# Patient Record
Sex: Female | Born: 1985 | Race: Black or African American | Hispanic: No | Marital: Single | Smoking: Never smoker
Health system: Southern US, Community
[De-identification: ages and names within clinical notes are randomized; demographics above are authoritative.]

## PROBLEM LIST (undated history)

## (undated) DIAGNOSIS — O039 Complete or unspecified spontaneous abortion without complication: Secondary | ICD-10-CM

## (undated) HISTORY — PX: WISDOM TOOTH EXTRACTION: SHX21

## (undated) HISTORY — DX: Complete or unspecified spontaneous abortion without complication: O03.9

---

## 2017-09-29 ENCOUNTER — Emergency Department: Payer: BLUE CROSS/BLUE SHIELD

## 2017-09-29 ENCOUNTER — Emergency Department
Admission: EM | Admit: 2017-09-29 | Discharge: 2017-09-29 | Disposition: A | Discharge status: Home / Self Care | Payer: BLUE CROSS/BLUE SHIELD | Attending: Emergency Medicine | Admitting: Emergency Medicine

## 2017-09-29 ENCOUNTER — Encounter: Payer: Self-pay | Admitting: Emergency Medicine

## 2017-09-29 DIAGNOSIS — O4691 Antepartum hemorrhage, unspecified, first trimester: Secondary | ICD-10-CM | POA: Diagnosis not present

## 2017-09-29 DIAGNOSIS — O209 Hemorrhage in early pregnancy, unspecified: Secondary | ICD-10-CM | POA: Diagnosis not present

## 2017-09-29 DIAGNOSIS — N939 Abnormal uterine and vaginal bleeding, unspecified: Secondary | ICD-10-CM | POA: Diagnosis not present

## 2017-09-29 DIAGNOSIS — Z3A10 10 weeks gestation of pregnancy: Secondary | ICD-10-CM | POA: Insufficient documentation

## 2017-09-29 DIAGNOSIS — O4690 Antepartum hemorrhage, unspecified, unspecified trimester: Secondary | ICD-10-CM

## 2017-09-29 LAB — HCG, QUANTITATIVE, PREGNANCY: HCG, BETA CHAIN, QUANT, S: 139490 m[IU]/mL — AB (ref <5)

## 2017-09-29 LAB — CBC
HCT: 37.3 % (ref 35.0–47.0)
Hemoglobin: 12.7 g/dL (ref 12.0–16.0)
MCH: 33.1 pg (ref 26.0–34.0)
MCHC: 34 g/dL (ref 32.0–36.0)
MCV: 97.3 fL (ref 80.0–100.0)
PLATELETS: 299 10*3/uL (ref 150–440)
RBC: 3.83 MIL/uL (ref 3.80–5.20)
RDW: 12.9 % (ref 11.5–14.5)
WBC: 6.2 10*3/uL (ref 3.6–11.0)

## 2017-09-29 LAB — ABO/RH: ABO/RH(D): O POS

## 2017-09-29 NOTE — ED Notes (Signed)
Pt taken to US

## 2017-09-29 NOTE — ED Triage Notes (Signed)
Pt to ED c/o vaginal bleeding today.  States is pregnant but unsure how far along.  Has had abd cramping x1 week.  OBGYN appt tomorrow.

## 2017-09-29 NOTE — ED Notes (Signed)
Pt states abd cramping x week, noticed today vaginal bleeding. Denies clots. States bright red blood. Thinks she is about [redacted] weeks pregnant. 4th pregnancy, 2 living, 1 abortion. Alert, oriented, ambulatory. No previous pregnancy complications.

## 2017-09-29 NOTE — ED Provider Notes (Signed)
[facility] Emergency Department Provider Note   ____________________________________________    I have reviewed the triage vital signs and the nursing notes.   HISTORY  Chief Complaint Vaginal Bleeding     HPI Amanda Stuart is a 31 y.o. female Who presents with complaints of vaginal bleeding. Patient reports she is approximately [redacted] weeks pregnant. G4 P2. Last period was August 5. She reports she developed mild lower abdominal cramping sensation yesterday evening. This morning while she was at work she went to the bathroom and noted vaginal bleeding. No dizziness.   History reviewed. No pertinent past medical history.  There are no active problems to display for this patient.   Past Surgical History:  Procedure Laterality Date  . CESAREAN SECTION    . WISDOM TOOTH EXTRACTION      Prior to Admission medications   Not on File     Allergies Morphine and related  History reviewed. No pertinent family history.  Social History Social History  Substance Use Topics  . Smoking status: Never Smoker  . Smokeless tobacco: Never Used  . Alcohol use Yes     Comment: occ.    Review of Systems  Constitutional: No fever/chills Eyes: No visual changes.  ENT: No sore throat. Cardiovascular: Denies chest pain. Respiratory: Denies shortness of breath. Gastrointestinal: No nausea, no vomiting.   Genitourinary: vaginal bleeding as above Musculoskeletal: Negative for back pain. Skin: Negative for rash. Neurological: Negative for headaches   ____________________________________________   PHYSICAL EXAM:  VITAL SIGNS: ED Triage Vitals  Enc Vitals Group     BP 09/29/17 1622 121/77     Pulse Rate 09/29/17 1622 71     Resp 09/29/17 1622 20     Temp 09/29/17 1622 98.1 F (36.7 C)     Temp Source 09/29/17 1622 Oral     SpO2 09/29/17 1622 100 %     Weight 09/29/17 1623 71.7 kg (158 lb)     Height 09/29/17 1623 1.676 m ( )   Head Circumference --      Peak Flow --      Pain Score 09/29/17 1623 7     Pain Loc --      Pain Edu? --      Excl. in GC? --     Constitutional: Alert and oriented. No acute distress. Pleasant and interactive Eyes: Conjunctivae are normal.   Nose: No congestion/rhinnorhea. Mouth/Throat: Mucous membranes are moist.    Cardiovascular: Normal rate, regular rhythm.   Good peripheral circulation. Respiratory: Normal respiratory effort.  No retractions. Gastrointestinal: Soft and nontender. No distention.  No CVA tenderness. Genitourinary: deferred Musculoskeletal:   Warm and well perfused Neurologic:  Normal speech and language. No gross focal neurologic deficits are appreciated.  Skin:  Skin is warm, dry and intact. No rash noted. Psychiatric: Mood and affect are normal. Speech and behavior are normal.  ____________________________________________   LABS (all labs ordered are listed, but only abnormal results are displayed)  Labs Reviewed  HCG, QUANTITATIVE, PREGNANCY - Abnormal; Notable for the following:       Result Value   hCG, Beta Chain, Quant, S 139,490 (*)    All other components within normal limits  CBC  ABO/RH   ____________________________________________  EKG  None ____________________________________________  RADIOLOGY  ultrasoundshows 10 week IUP ____________________________________________   PROCEDURES  Procedure(s) performed: No    Critical Care performed:No ____________________________________________   INITIAL IMPRESSION / ASSESSMENT AND PLAN / ED COURSE  Pertinent labs &  imaging results that were available during my care of the patient were reviewed by me and considered in my medical decision making (see chart for details).  patient presents with complaints of vaginal bleeding as above.  Differential diagnosis includes miscarriage, ectopic pregnancy, threatened miscarriage  We will check labs/hcg and obtain US  ultrasound is  reassuring, patient is a positive, vitals are stable. Hemoglobin is normal. Has OB follow-up tomorrow    ____________________________________________   FINAL CLINICAL IMPRESSION(S) / ED DIAGNOSES  Final diagnoses:  Vaginal bleeding in pregnancy      NEW MEDICATIONS STARTED DURING THIS VISIT:  There are no discharge medications for this patient.    Note:  This document was prepared using Dragon voice recognition software and may include unintentional dictation errors.    Jene Every, MD 09/29/17 2328

## 2017-09-29 NOTE — ED Notes (Signed)
Called lab about ABO/Rh and CBC. They stated they could run tests.

## 2017-10-01 ENCOUNTER — Encounter: Payer: Self-pay | Admitting: Obstetrics and Gynecology

## 2017-10-01 ENCOUNTER — Ambulatory Visit (INDEPENDENT_AMBULATORY_CARE_PROVIDER_SITE_OTHER): Payer: BLUE CROSS/BLUE SHIELD | Admitting: Obstetrics and Gynecology

## 2017-10-01 VITALS — BP 110/71 | HR 68 | Ht 66.0 in | Wt 156.1 lb

## 2017-10-01 DIAGNOSIS — L409 Psoriasis, unspecified: Secondary | ICD-10-CM

## 2017-10-01 DIAGNOSIS — B3731 Acute candidiasis of vulva and vagina: Secondary | ICD-10-CM

## 2017-10-01 DIAGNOSIS — B373 Candidiasis of vulva and vagina: Secondary | ICD-10-CM

## 2017-10-01 DIAGNOSIS — O200 Threatened abortion: Secondary | ICD-10-CM

## 2017-10-01 MED ORDER — TRIAMCINOLONE ACETONIDE 0.1 % EX CREA: TOPICAL_CREAM | Freq: Two times a day (BID) | CUTANEOUS | Status: DC

## 2017-10-01 NOTE — Progress Notes (Signed)
HPI:      Amanda Stuart is a 31 y.o. [phone] who LMP was Patient's last menstrual period was 07/27/2017 (exact date).  Subjective:   She presents today Because she was initially found to be pregnant and has been having vaginal bleeding. She visited the emergency department and they did an ultrasound revealing a 10 week intrauterine pregnancy with normal heart tones and no evidence of subchorionic hemorrhage. She reports her bleeding as brown spotting at this time. Her bleeding did follow intercourse 2 days ago. Her principal history is significant for previous cesarean delivery followed by VBAC. She is requesting TOLAC with this pregnancy. She has begun prenatal vitamins. She has psoriasis and uses triamcinolone cream and needs a refill. (She reports that she uses it sparingly and only for a day or 2 at a time.)      Hx: The following portions of the patient's history were reviewed and updated as appropriate:             She  has no past medical history on file. She  does not have a problem list on file. She  has a past surgical history that includes Wisdom tooth extraction and Cesarean section. Her family history includes Breast cancer in her other; Colon cancer in her maternal uncle; Ovarian cancer in her maternal grandmother; Stroke in her maternal uncle; Uterine cancer in her mother. She  reports that she has never smoked. She has never used smokeless tobacco. She reports that she does not drink alcohol or use drugs. She is allergic to morphine and related.       Review of Systems:  Review of Systems  Constitutional: Denied constitutional symptoms, night sweats, recent illness, fatigue, fever, insomnia and weight loss.  Eyes: Denied eye symptoms, eye pain, photophobia, vision change and visual disturbance.  Ears/Nose/Throat/Neck: Denied ear, nose, throat or neck symptoms, hearing loss, nasal discharge, sinus congestion and sore throat.  Cardiovascular: Denied cardiovascular  symptoms, arrhythmia, chest pain/pressure, edema, exercise intolerance, orthopnea and palpitations.  Respiratory: Denied pulmonary symptoms, asthma, pleuritic pain, productive sputum, cough, dyspnea and wheezing.  Gastrointestinal: Denied, gastro-esophageal reflux, melena, nausea and vomiting.  Genitourinary: See HPI for additional information.  Musculoskeletal: Denied musculoskeletal symptoms, stiffness, swelling, muscle weakness and myalgia.  Dermatologic: Denied dermatology symptoms, rash and scar.  Neurologic: Denied neurology symptoms, dizziness, headache, neck pain and syncope.  Psychiatric: Denied psychiatric symptoms, anxiety and depression.  Endocrine: Denied endocrine symptoms including hot flashes and night sweats.   Meds:   No current outpatient prescriptions on file prior to visit.   No current facility-administered medications on file prior to visit.     Objective:     Vitals:   10/01/17 1112  BP: 110/71  Pulse: 68              Physical examination   Pelvic:   Vulva: Normal appearance.  No lesions.  Vagina: No lesions or abnormalities noted.  Small amount of dark blood noted.   Support: Normal pelvic support.  Urethra No masses tenderness or scarring.  Meatus Normal size without lesions or prolapse.  Cervix: Normal appearance.  No lesions.  Anus: Normal exam.  No lesions.  Perineum: Normal exam.  No lesions.        Bimanual   Uterus: 10 weeks size  Non-tender.  Mobile.  AV.  Adnexae: No masses.  Non-tender to palpation.  Cul-de-sac: Negative for abnormality.   WET PREP: clue cells: absent, KOH (yeast): positive, odor: absent and trichomoniasis: negative Ph:  <  4.5    Assessment:    [id number] There are no active problems to display for this patient.    1. Threatened abortion in first trimester   2. Monilial vulvovaginitis   3. Psoriasis        Plan:            1.  SAb I have discussed the possibility of miscarriage with the patient.  I have  informed her that vaginal bleeding, cramping or passage of tissue are the most common signs of miscarriage.  Should she have heavy bleeding, pass tissue or have other problems, she has been informed to call the office immediately.  I have advised her to remain at pelvic rest for at least one week after her last episode of bleeding.  If she is currently working, I have instructed her to discontinue until this situation resolves.  Complete versus incomplete miscarriage was discussed and the patient is aware that should she develop heavy bleeding, fever, or persistent crampy pelvic pain she may require a D & E to remove the remaining portion of the miscarried pregnancy.  I advised her to keep me informed should her condition change. Normal intrauterine pregnancy-doubt high risk of miscarriage. 2.  Pap GC/CT performed 3.  Continue prenatal vitamins 4.  Nurse visit scheduled 5.  Triamcinolone cream for psoriasis discussed 6.  Monistat for monilia (symptomatic in first trimester)  Orders No orders of the defined types were placed in this encounter.    Meds ordered this encounter  Medications  . triamcinolone cream (KENALOG) 0.1 %      F/U  Return in about 3 weeks (around 10/22/2017).  Elonda Husky, M.D. 10/01/2017 12:58 PM

## 2017-10-02 ENCOUNTER — Telehealth: Payer: Self-pay | Admitting: Obstetrics and Gynecology

## 2017-10-02 MED ORDER — TRIAMCINOLONE ACETONIDE 0.1 % EX CREA: 1.0000 "application " | TOPICAL_CREAM | Freq: Two times a day (BID) | CUTANEOUS | 0 refills | Status: AC

## 2017-10-02 NOTE — Telephone Encounter (Signed)
Message left on pts voicemail- script sent to pharmacy and confirmation received.

## 2017-10-02 NOTE — Telephone Encounter (Signed)
Patient called and stated that she went to her pharmacy yesterday to pick up her prescription, and her medication was not there triamcinolone cream (KENALOG) 0.1 %. The patient stated that her preferred pharmacy is [facility] [address] in [place]. No other information was disclosed. Please advise.

## 2017-10-03 ENCOUNTER — Telehealth: Payer: Self-pay

## 2017-10-03 LAB — PAP IG, CT-NG, RFX HPV ASCU
CHLAMYDIA, NUC. ACID AMP: NEGATIVE
Gonococcus by Nucleic Acid Amp: NEGATIVE
PAP SMEAR COMMENT: 0

## 2017-10-03 NOTE — Telephone Encounter (Signed)
Message left on pts voicemail- neg results per provider. 

## 2017-10-03 NOTE — Telephone Encounter (Signed)
-----   Message from Linzie Collin, MD sent at 10/03/2017  8:49 AM EDT ----- Negative Pap and Negative GC/CT and yeast infection confirmed

## 2017-10-06 ENCOUNTER — Telehealth: Payer: Self-pay

## 2017-10-06 MED ORDER — TERCONAZOLE 0.4 % VA CREA: 1.0000 | TOPICAL_CREAM | Freq: Every day | VAGINAL | 0 refills | Status: DC

## 2017-10-06 NOTE — Telephone Encounter (Signed)
Spoke with pt- advised of negative test results per provider with understanding. Also states she still has yeast infection symptoms- has tried OTC products with no relief. C/o vag itching and discharge. Would like something sent into [facility] on [address] [place].

## 2017-10-06 NOTE — Telephone Encounter (Signed)
-----   Message from David James Evans, MD sent at 10/03/2017  8:49 AM EDT ----- Negative Pap and Negative GC/CT and yeast infection confirmed 

## 2017-10-06 NOTE — Telephone Encounter (Signed)
Pt informed script is at [facility] on [address].

## 2017-10-06 NOTE — Telephone Encounter (Signed)
Informed pt of negative result per provider and she is c/o vaginal itching and discharge- provider informed.

## 2017-10-13 ENCOUNTER — Ambulatory Visit (INDEPENDENT_AMBULATORY_CARE_PROVIDER_SITE_OTHER): Payer: BLUE CROSS/BLUE SHIELD | Admitting: Obstetrics and Gynecology

## 2017-10-13 ENCOUNTER — Telehealth: Payer: Self-pay

## 2017-10-13 VITALS — BP 101/73 | HR 69 | Ht 66.0 in | Wt 161.3 lb

## 2017-10-13 DIAGNOSIS — Z3481 Encounter for supervision of other normal pregnancy, first trimester: Secondary | ICD-10-CM | POA: Diagnosis not present

## 2017-10-13 DIAGNOSIS — Z113 Encounter for screening for infections with a predominantly sexual mode of transmission: Secondary | ICD-10-CM

## 2017-10-13 DIAGNOSIS — Z1389 Encounter for screening for other disorder: Secondary | ICD-10-CM

## 2017-10-13 NOTE — Patient Instructions (Signed)
Pregnancy and Zika Virus Disease Zika virus disease, or Zika, is an illness that can spread to people from mosquitoes that carry the virus. It may also spread from person to person through infected body fluids. Zika first occurred in [place], but recently it has spread to new areas. The virus occurs in tropical climates. The location of Zika continues to change. Most people who become infected with Zika virus do not develop serious illness. However, Zika may cause birth defects in an unborn baby whose mother is infected with the virus. It may also increase the risk of miscarriage. What are the symptoms of Zika virus disease? In many cases, people who have been infected with Zika virus do not develop any symptoms. If symptoms appear, they usually start about a week after the person is infected. Symptoms are usually mild. They may include:  Fever.  Rash.  Red eyes.  Joint pain.  How does Zika virus disease spread? The main way that Zika virus spreads is through the bite of a certain type of mosquito. Unlike most types of mosquitos, which bite only at night, the type of mosquito that carries Zika virus bites both at night and during the day. Zika virus can also spread through sexual contact, through a blood transfusion, and from a mother to her baby before or during birth. Once you have had Zika virus disease, it is unlikely that you will get it again. Can I pass Zika to my baby during pregnancy? Yes, Zika can pass from a mother to her baby before or during birth. What problems can Zika cause for my baby? A woman who is infected with Zika virus while pregnant is at risk of having her baby born with a condition in which the brain or head is smaller than expected (microcephaly). Babies who have microcephaly can have developmental delays, seizures, hearing problems, and vision problems. Having Zika virus disease during pregnancy can also increase the risk of miscarriage. How can Zika virus disease be  prevented? There is no vaccine to prevent Zika. The best way to prevent the disease is to avoid infected mosquitoes and avoid exposure to body fluids that can spread the virus. Avoid any possible exposure to Zika by taking the following precautions. For women and their sex partners:  Avoid traveling to high-risk areas. The locations where Zika is being reported change often. To identify high-risk areas, check the CDC travel website: www.cdc.gov/zika/geo/index.html  If you or your sex partner must travel to a high-risk area, talk with a health care provider before and after traveling.  Take all precautions to avoid mosquito bites if you live in, or travel to, any of the high-risk areas. Insect repellents are safe to use during pregnancy.  Ask your health care provider when it is safe to have sexual contact.  For women:  If you are pregnant or trying to become pregnant, avoid sexual contact with persons who may have been exposed to Zika virus, persons who have possible symptoms of Zika, or persons whose history you are unsure about. If you choose to have sexual contact with someone who may have been exposed to Zika virus, use condoms correctly during the entire duration of sexual activity, every time. Do not share sexual devices, as you may be exposed to body fluids.  Ask your health care provider about when it is safe to attempt pregnancy after a possible exposure to Zika virus.  What steps should I take to avoid mosquito bites? Take these steps to avoid mosquito bites   when you are in a high-risk area:  Wear loose clothing that covers your arms and legs.  Limit your outdoor activities.  Do not open windows unless they have window screens.  Sleep under mosquito nets.  Use insect repellent. The best insect repellents have:  DEET, picaridin, oil of lemon eucalyptus (OLE), or IR3535 in them.  Higher amounts of an active ingredient in them.  Remember that insect repellents are safe to  use during pregnancy.  Do not use OLE on children who are younger than 3 years of age. Do not use insect repellent on babies who are younger than 2 months of age.  Cover your child's stroller with mosquito netting. Make sure the netting fits snugly and that any loose netting does not cover your child's mouth or nose. Do not use a blanket as a mosquito-protection cover.  Do not apply insect repellent underneath clothing.  If you are using sunscreen, apply the sunscreen before applying the insect repellent.  Treat clothing with permethrin. Do not apply permethrin directly to your skin. Follow label directions for safe use.  Get rid of standing water, where mosquitoes may reproduce. Standing water is often found in items such as buckets, bowls, animal food dishes, and flowerpots.  When you return from traveling to any high-risk area, continue taking actions to protect yourself against mosquito bites for 3 weeks, even if you show no signs of illness. This will prevent spreading Zika virus to uninfected mosquitoes. What should I know about the sexual transmission of Zika? People can spread Zika to their sexual partners during vaginal, anal, or oral sex, or by sharing sexual devices. Many people with Zika do not develop symptoms, so a person could spread the disease without knowing that they are infected. The greatest risk is to women who are pregnant or who may become pregnant. Zika virus can live longer in semen than it can live in blood. Couples can prevent sexual transmission of the virus by:  Using condoms correctly during the entire duration of sexual activity, every time. This includes vaginal, anal, and oral sex.  Not sharing sexual devices. Sharing increases your risk of being exposed to body fluid from another person.  Avoiding all sexual activity until your health care provider says it is safe.  Should I be tested for Zika virus? A sample of your blood can be tested for Zika virus. A  pregnant woman should be tested if she may have been exposed to the virus or if she has symptoms of Zika. She may also have additional tests done during her pregnancy, such ultrasound testing. Talk with your health care provider about which tests are recommended. This information is not intended to replace advice given to you by your health care provider. Make sure you discuss any questions you have with your health care provider. Document Released: 08/30/2015 Document Revised: 05/16/2016 Document Reviewed: 08/23/2015 Elsevier Interactive Patient Education  2018 [employer] Commonly Asked Questions During Pregnancy  Cats: A parasite can be excreted in cat feces.  To avoid exposure you need to have another person empty the little box.  If you must empty the litter box you will need to wear gloves.  Wash your hands after handling your cat.  This parasite can also be found in raw or undercooked meat so this should also be avoided.  Colds, Sore Throats, Flu: Please check your medication sheet to see what you can take for symptoms.  If your symptoms are unrelieved by these medications please call the   office.  Dental Work: Most any dental work your dentist recommends is permitted.  X-rays should only be taken during the first trimester if absolutely necessary.  Your abdomen should be shielded with a lead apron during all x-rays.  Please notify your provider prior to receiving any x-rays.  Novocaine is fine; gas is not recommended.  If your dentist requires a note from us prior to dental work please call the office and we will provide one for you.  Exercise: Exercise is an important part of staying healthy during your pregnancy.  You may continue most exercises you were accustomed to prior to pregnancy.  Later in your pregnancy you will most likely notice you have difficulty with activities requiring balance like riding a bicycle.  It is important that you listen to your body and avoid activities that put  you at a higher risk of falling.  Adequate rest and staying well hydrated are a must!  If you have questions about the safety of specific activities ask your provider.    Exposure to Children with illness: Try to avoid obvious exposure; report any symptoms to us when noted,  If you have chicken pos, red measles or mumps, you should be immune to these diseases.   Please do not take any vaccines while pregnant unless you have checked with your OB provider.  Fetal Movement: After 28 weeks we recommend you do "kick counts" twice daily.  Lie or sit down in a calm quiet environment and count your baby movements "kicks".  You should feel your baby at least 10 times per hour.  If you have not felt 10 kicks within the first hour get up, walk around and have something sweet to eat or drink then repeat for an additional hour.  If count remains less than 10 per hour notify your provider.  Fumigating: Follow your pest control agent's advice as to how long to stay out of your home.  Ventilate the area well before re-entering.  Hemorrhoids:   Most over-the-counter preparations can be used during pregnancy.  Check your medication to see what is safe to use.  It is important to use a stool softener or fiber in your diet and to drink lots of liquids.  If hemorrhoids seem to be getting worse please call the office.   Hot Tubs:  Hot tubs Jacuzzis and saunas are not recommended while pregnant.  These increase your internal body temperature and should be avoided.  Intercourse:  Sexual intercourse is safe during pregnancy as long as you are comfortable, unless otherwise advised by your provider.  Spotting may occur after intercourse; report any bright red bleeding that is heavier than spotting.  Labor:  If you know that you are in labor, please go to the hospital.  If you are unsure, please call the office and let us help you decide what to do.  Lifting, straining, etc:  If your job requires heavy lifting or straining  please check with your provider for any limitations.  Generally, you should not lift items heavier than that you can lift simply with your hands and arms (no back muscles)  Painting:  Paint fumes do not harm your pregnancy, but may make you ill and should be avoided if possible.  Latex or water based paints have less odor than oils.  Use adequate ventilation while painting.  Permanents & Hair Color:  Chemicals in hair dyes are not recommended as they cause increase hair dryness which can increase hair loss during pregnancy.  "   Highlighting" and permanents are allowed.  Dye may be absorbed differently and permanents may not hold as well during pregnancy.  Sunbathing:  Use a sunscreen, as skin burns easily during pregnancy.  Drink plenty of fluids; avoid over heating.  Tanning Beds:  Because their possible side effects are still unknown, tanning beds are not recommended.  Ultrasound Scans:  Routine ultrasounds are performed at approximately 20 weeks.  You will be able to see your baby's general anatomy an if you would like to know the gender this can usually be determined as well.  If it is questionable when you conceived you may also receive an ultrasound early in your pregnancy for dating purposes.  Otherwise ultrasound exams are not routinely performed unless there is a medical necessity.  Although you can request a scan we ask that you pay for it when conducted because insurance does not cover " patient request" scans.  Work: If your pregnancy proceeds without complications you may work until your due date, unless your physician or employer advises otherwise.  Round Ligament Pain/Pelvic Discomfort:  Sharp, shooting pains not associated with bleeding are fairly common, usually occurring in the second trimester of pregnancy.  They tend to be worse when standing up or when you remain standing for long periods of time.  These are the result of pressure of certain pelvic ligaments called "round  ligaments".  Rest, Tylenol and heat seem to be the most effective relief.  As the womb and fetus grow, they rise out of the pelvis and the discomfort improves.  Please notify the office if your pain seems different than that described.  It may represent a more serious condition.   

## 2017-10-13 NOTE — Progress Notes (Signed)
Amanda Stuart presents for NOB nurse interview visit. Pregnancy confirmation done at ED on 09/29/2017. BHCG: 139,490. Pt was seen for vaginal spotting. Currently having some brown spotting 2-3 days after intercourse. No bright red vaginal bleeding. LMP: 07/27/2017-exact. Ultrasound done 09/29/2017 at ED which resulted in EGA-10wks.  Rh/ABO (O+).  G-4.  P-2012. Pregnancy education material explained and given.  No cats in the home. NOB labs ordered. HIV labs and Drug screen were explained optional.  Drug screen/nicotine screening declined. PNV encouraged. Genetic screening options discussed. Genetic testing: Declined.  Pt may discuss with provider. Pt. To follow up with provider as scheduled with Dr. Logan BoresEvans for NOB physical.  All questions answered.

## 2017-10-13 NOTE — Telephone Encounter (Signed)
Pt would like a refill of Triamcinolone Cream (Kenalog).

## 2017-10-14 LAB — URINALYSIS, ROUTINE W REFLEX MICROSCOPIC
Bilirubin, UA: NEGATIVE
GLUCOSE, UA: NEGATIVE
KETONES UA: NEGATIVE
LEUKOCYTES UA: NEGATIVE
NITRITE UA: NEGATIVE
PH UA: 6 (ref 5.0–7.5)
PROTEIN UA: NEGATIVE
Specific Gravity, UA: 1.019 (ref 1.005–1.030)
UUROB: 0.2 mg/dL (ref 0.2–1.0)

## 2017-10-14 LAB — CBC WITH DIFFERENTIAL/PLATELET
BASOS: 1 %
Basophils Absolute: 0 10*3/uL (ref 0.0–0.2)
EOS (ABSOLUTE): 0.1 10*3/uL (ref 0.0–0.4)
Eos: 1 %
Hematocrit: 38.9 % (ref 34.0–46.6)
Hemoglobin: 12.4 g/dL (ref 11.1–15.9)
Immature Grans (Abs): 0 10*3/uL (ref 0.0–0.1)
Immature Granulocytes: 0 %
Lymphocytes Absolute: 1.1 10*3/uL (ref 0.7–3.1)
Lymphs: 21 %
MCH: 32.7 pg (ref 26.6–33.0)
MCHC: 31.9 g/dL (ref 31.5–35.7)
MCV: 103 fL — ABNORMAL HIGH (ref 79–97)
MONOS ABS: 0.3 10*3/uL (ref 0.1–0.9)
Monocytes: 6 %
NEUTROS ABS: 3.8 10*3/uL (ref 1.4–7.0)
Neutrophils: 71 %
PLATELETS: 350 10*3/uL (ref 150–379)
RBC: 3.79 x10E6/uL (ref 3.77–5.28)
RDW: 12.7 % (ref 12.3–15.4)
WBC: 5.3 10*3/uL (ref 3.4–10.8)

## 2017-10-14 LAB — RPR: RPR Ser Ql: NONREACTIVE

## 2017-10-14 LAB — RUBELLA SCREEN: Rubella Antibodies, IGG: 1.89 index (ref >0.99)

## 2017-10-14 LAB — MICROSCOPIC EXAMINATION: Casts: NONE SEEN /lpf

## 2017-10-14 LAB — VARICELLA ZOSTER ANTIBODY, IGG: Varicella zoster IgG: <135 index — ABNORMAL LOW (ref >165)

## 2017-10-14 LAB — ANTIBODY SCREEN: ANTIBODY SCREEN: NEGATIVE

## 2017-10-14 LAB — HIV ANTIBODY (ROUTINE TESTING W REFLEX): HIV Screen 4th Generation wRfx: NONREACTIVE

## 2017-10-14 LAB — HEPATITIS B SURFACE ANTIGEN: Hepatitis B Surface Ag: NEGATIVE

## 2017-10-14 LAB — SICKLE CELL SCREEN: Sickle Cell Screen: NEGATIVE

## 2017-10-15 LAB — GC/CHLAMYDIA PROBE AMP
Chlamydia trachomatis, NAA: NEGATIVE
Neisseria gonorrhoeae by PCR: NEGATIVE

## 2017-10-15 LAB — CULTURE, OB URINE

## 2017-10-15 LAB — URINE CULTURE, OB REFLEX

## 2017-10-16 NOTE — Telephone Encounter (Signed)
Will that be a yes or no? please

## 2017-10-22 ENCOUNTER — Ambulatory Visit (INDEPENDENT_AMBULATORY_CARE_PROVIDER_SITE_OTHER): Payer: BLUE CROSS/BLUE SHIELD | Admitting: Obstetrics and Gynecology

## 2017-10-22 ENCOUNTER — Encounter: Payer: Self-pay | Admitting: Obstetrics and Gynecology

## 2017-10-22 VITALS — BP 120/76 | HR 68 | Wt 158.2 lb

## 2017-10-22 DIAGNOSIS — Z3482 Encounter for supervision of other normal pregnancy, second trimester: Secondary | ICD-10-CM

## 2017-10-22 DIAGNOSIS — N898 Other specified noninflammatory disorders of vagina: Secondary | ICD-10-CM

## 2017-10-22 DIAGNOSIS — O26892 Other specified pregnancy related conditions, second trimester: Secondary | ICD-10-CM | POA: Diagnosis not present

## 2017-10-22 LAB — POCT URINALYSIS DIPSTICK
Bilirubin, UA: NEGATIVE
Blood, UA: NEGATIVE
Glucose, UA: NEGATIVE
KETONES UA: NEGATIVE
LEUKOCYTES UA: NEGATIVE
NITRITE UA: NEGATIVE
PH UA: 5 (ref 5.0–8.0)
PROTEIN UA: NEGATIVE
Spec Grav, UA: 1.015 (ref 1.010–1.025)
UROBILINOGEN UA: 0.2 U/dL

## 2017-10-22 NOTE — Progress Notes (Signed)
NOB: Discussed genetic testing in detail-patient considering.  Patient sure that she wants TOLAC - R/B reviewed in detail.  Complaining of vaginal irritation despite use of Monistat. -Nuswab done.  Physical examination General NAD, Conversant  HEENT Atraumatic; Op clear with mmm.  Normo-cephalic. Pupils reactive. Anicteric sclerae  Thyroid/Neck Smooth without nodularity or enlargement. Normal ROM.  Neck Supple.  Skin No rashes, lesions or ulceration. Normal palpated skin turgor. No nodularity.  Breasts: No masses or discharge.  Symmetric.  No axillary adenopathy.  Lungs: Clear to auscultation.No rales or wheezes. Normal Respiratory effort, no retractions.  Heart: NSR.  No murmurs or rubs appreciated. No periferal edema  Abdomen: Soft.  Non-tender.  No masses.  No HSM. No hernia  Extremities: Moves all appropriately.  Normal ROM for age. No lymphadenopathy.  Neuro: Oriented to PPT.  Normal mood. Normal affect.     Pelvic:   Vulva: Normal appearance.  No lesions.  Vagina: No lesions or abnormalities noted.  Support: Normal pelvic support.  Urethra No masses tenderness or scarring.  Meatus Normal size without lesions or prolapse.  Cervix: Normal appearance.  No lesions.  Anus: Normal exam.  No lesions.  Perineum: Normal exam.  No lesions.        Bimanual   Adnexae: No masses.  Non-tender to palpation.  Uterus: Enlarged. 13wks  143FHTs  Non-tender.  Mobile.  AV.  Adnexae: No masses.  Non-tender to palpation.  Cul-de-sac: Negative for abnormality.  Adnexae: No masses.  Non-tender to palpation.         Pelvimetry   Diagonal: Reached.  Spines: Average.  Sacrum: Concave.  Pubic Arch: Normal.

## 2017-10-25 LAB — NUSWAB VAGINITIS PLUS (VG+)
CANDIDA ALBICANS, NAA: NEGATIVE
CANDIDA GLABRATA, NAA: NEGATIVE
CHLAMYDIA TRACHOMATIS, NAA: NEGATIVE
NEISSERIA GONORRHOEAE, NAA: NEGATIVE
TRICH VAG BY NAA: NEGATIVE

## 2017-10-27 ENCOUNTER — Telehealth: Payer: Self-pay

## 2017-10-27 NOTE — Telephone Encounter (Signed)
Pt informed of negative test results 

## 2017-10-27 NOTE — Telephone Encounter (Signed)
-----   Message from Linzie Collinavid James Evans, MD sent at 10/27/2017  8:37 AM EST ----- All results within normal range.  Negative for infection.

## 2017-11-19 ENCOUNTER — Ambulatory Visit (INDEPENDENT_AMBULATORY_CARE_PROVIDER_SITE_OTHER): Payer: BLUE CROSS/BLUE SHIELD | Admitting: Obstetrics and Gynecology

## 2017-11-19 ENCOUNTER — Encounter: Payer: Self-pay | Admitting: Obstetrics and Gynecology

## 2017-11-19 VITALS — BP 106/67 | HR 66 | Wt 162.1 lb

## 2017-11-19 DIAGNOSIS — Z3482 Encounter for supervision of other normal pregnancy, second trimester: Secondary | ICD-10-CM

## 2017-11-19 NOTE — Progress Notes (Signed)
ROB- Pt is c/o headache, states she has tried tylenol, it has given her some relief, she wants to assure if this will be on going, pt has had some tightness in her stomach unsure if they are braxton hicks, Flu-Declines

## 2017-11-19 NOTE — Progress Notes (Signed)
ROB: Patient c/o of headaches almost daily, mostly relieved by Tylenol.  Notes that she was a big energy drink consumer prior to pregnancy. Discussed caffeine withdrawal effects, and that she can consume up to 1-2 cups of caffeine per day (not including energy drinks). Also noting some pelvic cramping and tightness but denies bleeding.  Declines flu vaccine and genetic screen. RTC in 4 weeks. For anatomy scan at that time.

## 2017-11-25 IMAGING — US US OB COMP LESS 14 WK
1 series · 14 of 28 positions shown · non-contrast
Comparison: None.

CLINICAL DATA: Cramping for 1 week.  Vaginal bleeding today.

EXAM:
OBSTETRIC <14 WK ULTRASOUND
TECHNIQUE: Transabdominal ultrasound was performed for evaluation of the
gestation as well as the maternal uterus and adnexal regions.

[Series 1: us ob comp less 14 wk · 0.13mm/px · 14 of 63 slices shown]
[im 3/63]
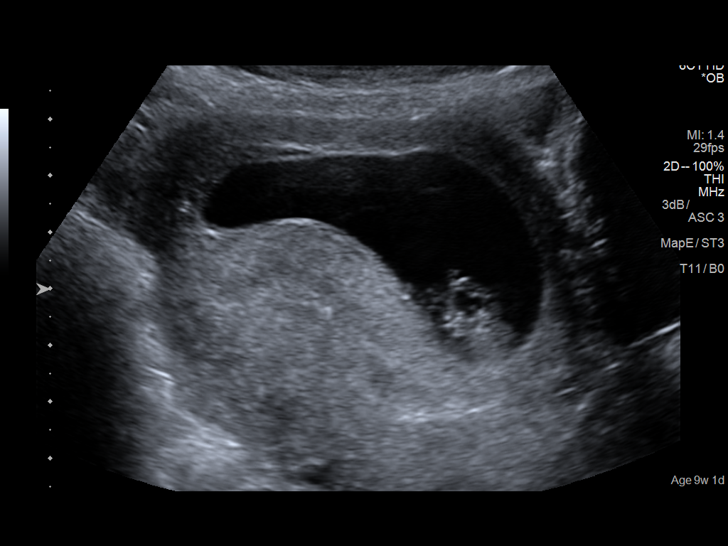
[im 7/63]
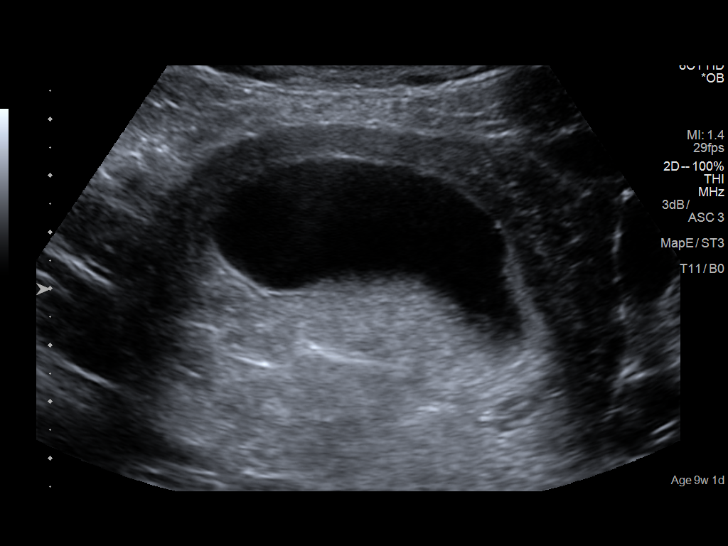
[im 12/63]
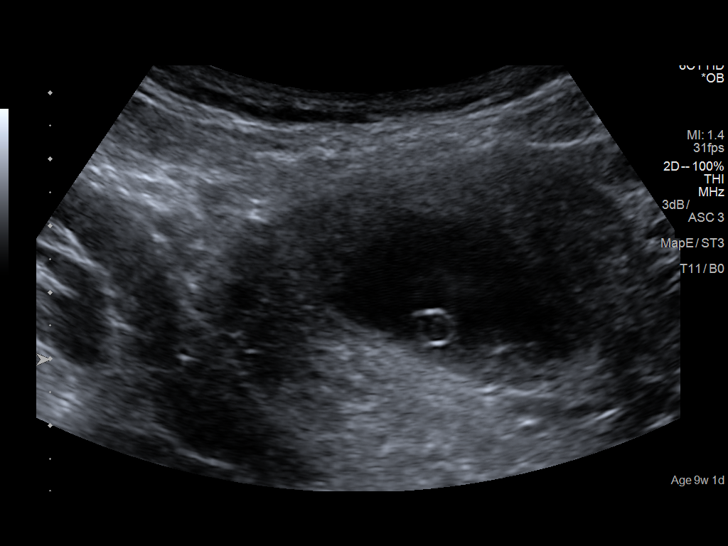
[im 17/63]
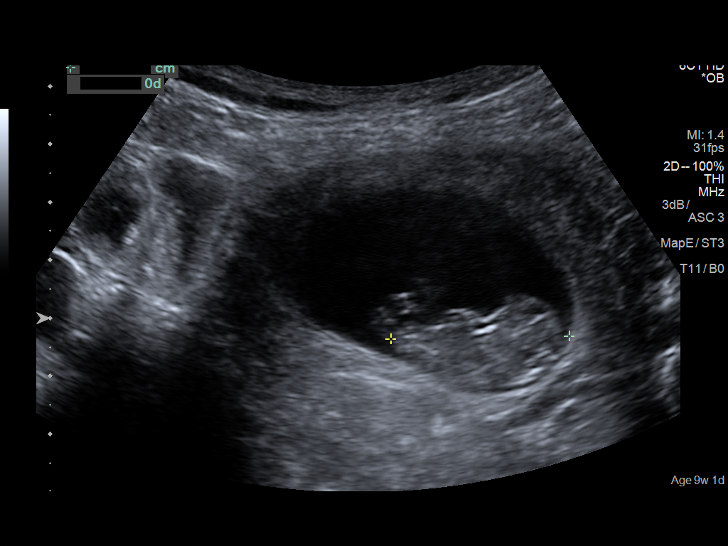
[im 21/63]
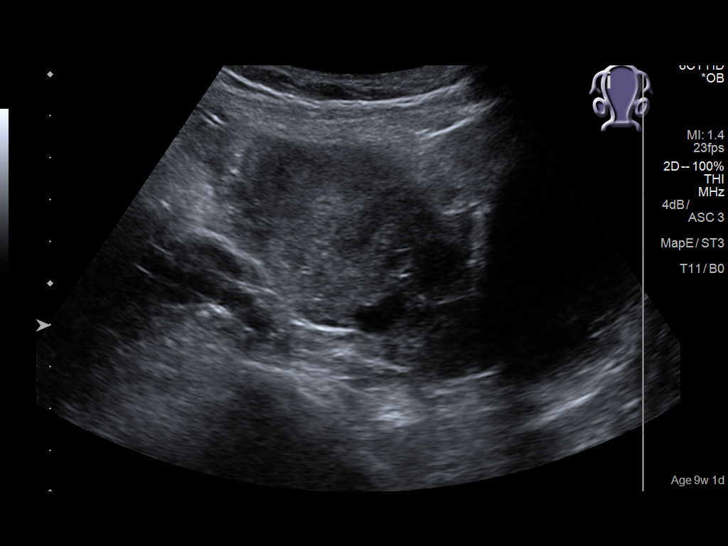
[im 26/63]
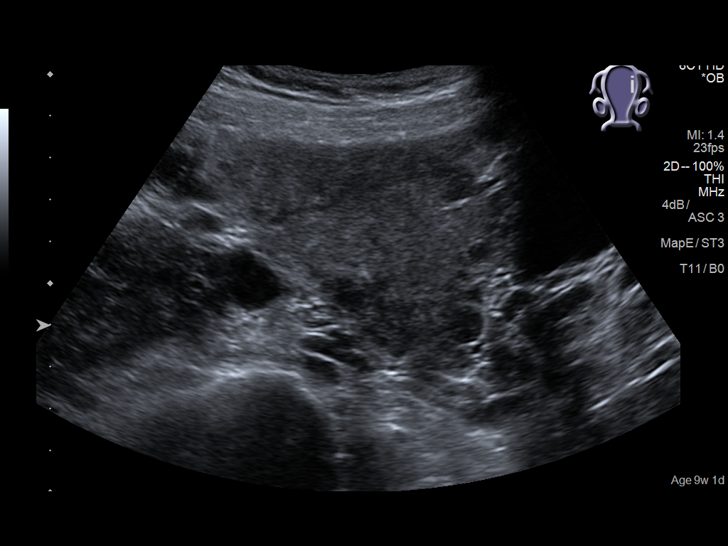
[im 30/63]
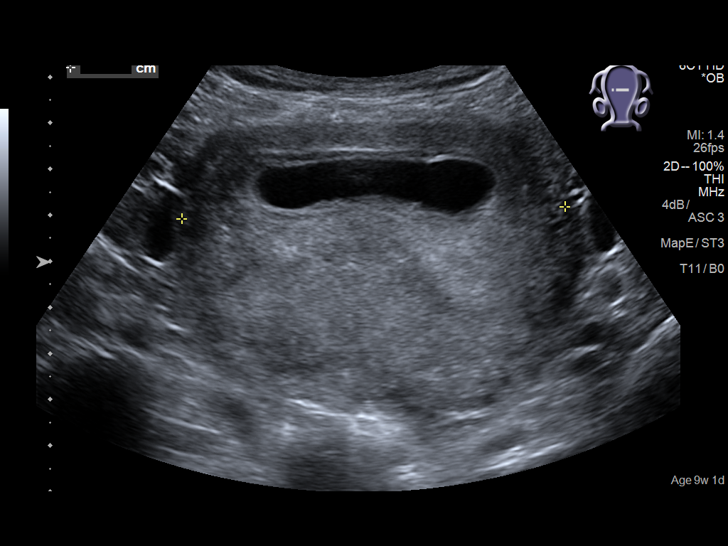
[im 35/63]
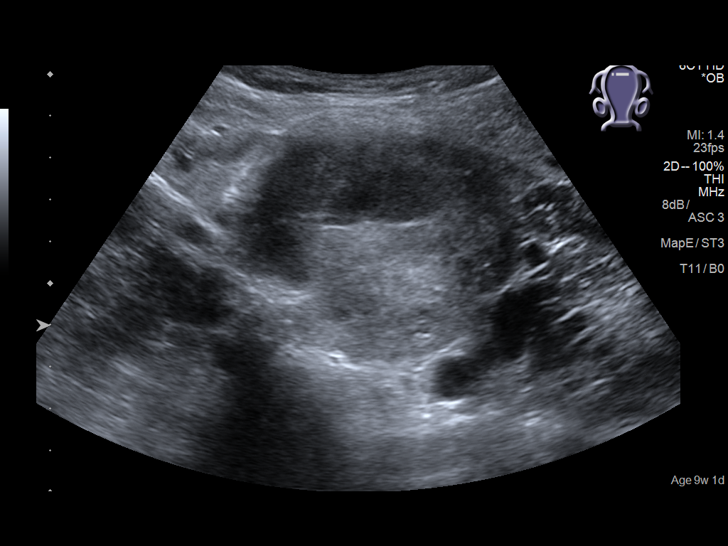
[im 40/63]
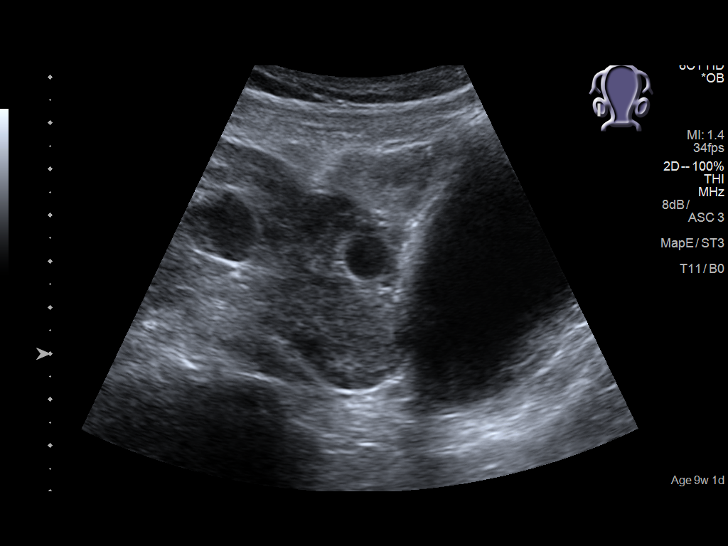
[im 44/63]
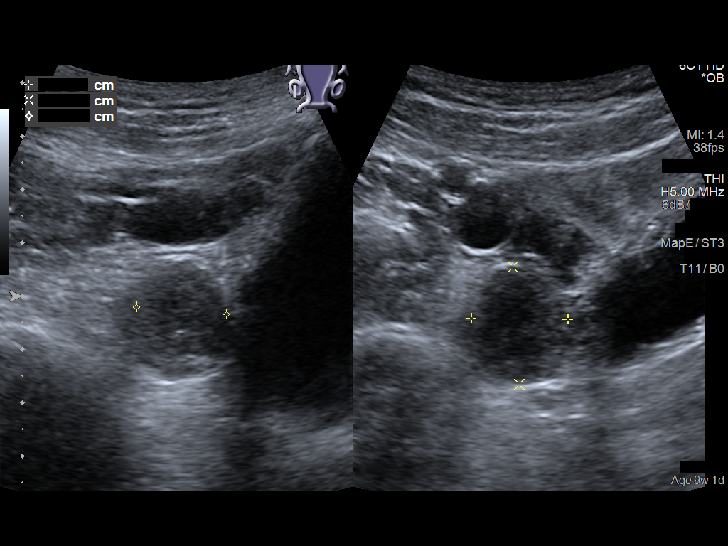
[im 49/63]
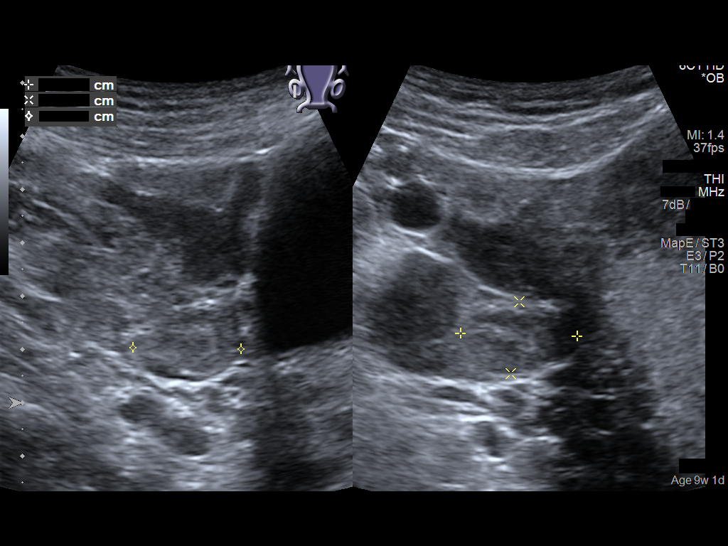
[im 53/63]
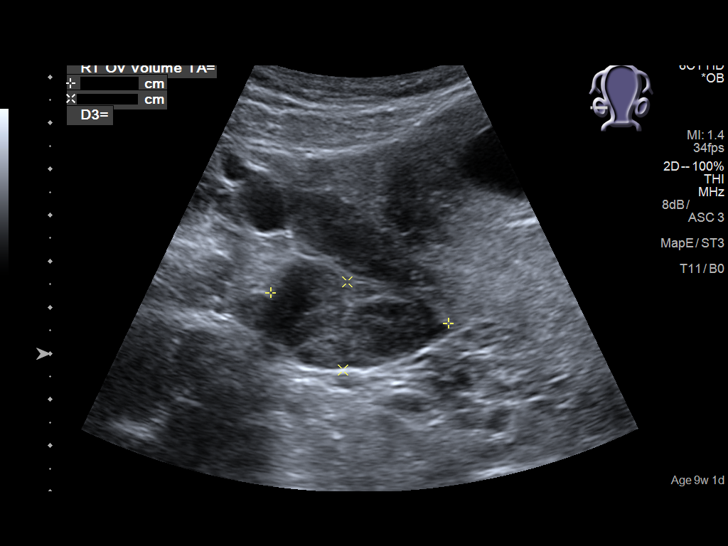
[im 58/63]
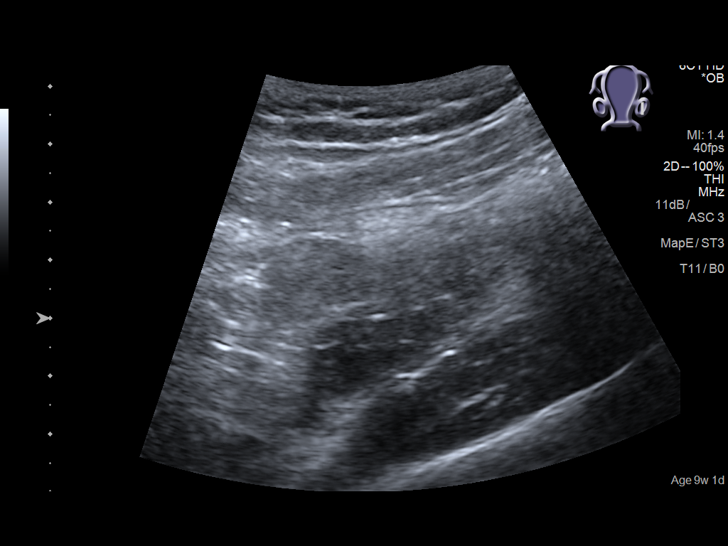
[im 63/63]
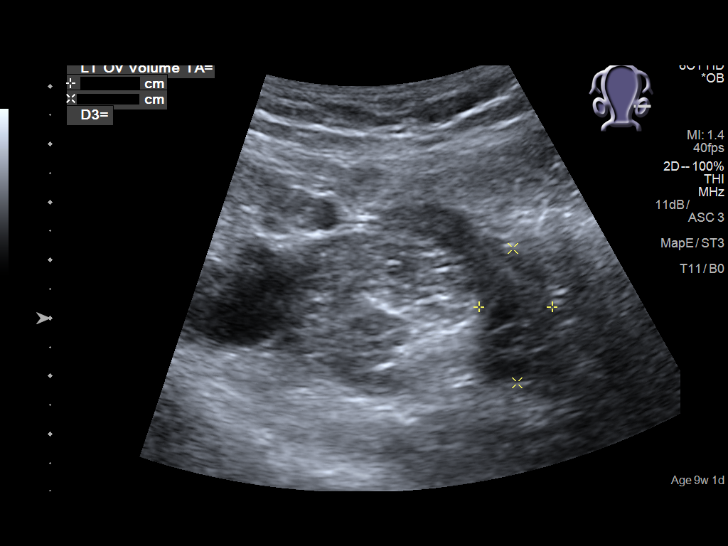

[14 of 28 positions shown; findings below may reference images not displayed]

FINDINGS: Intrauterine gestational sac: Single

Yolk sac:  Present

Embryo:  Present

Cardiac Activity: Present

Heart Rate: 155 bpm

MSD:   mm    w     d

CRL:   31  mm   10 w 0 d                  US EDC: 05/07/2018

Subchorionic hemorrhage:  None visualized.

Maternal uterus/adnexae:

Right ovary demonstrates a simple cyst in a probable hemorrhagic
corpus luteum cyst.

Left ovary is normal.

No free pelvic fluid collections.
IMPRESSION: Single living intrauterine fetus estimated at 10 weeks and 0 days
gestation.

No subchorionic hemorrhage.

## 2017-12-18 ENCOUNTER — Ambulatory Visit (INDEPENDENT_AMBULATORY_CARE_PROVIDER_SITE_OTHER): Payer: BLUE CROSS/BLUE SHIELD

## 2017-12-18 ENCOUNTER — Ambulatory Visit (INDEPENDENT_AMBULATORY_CARE_PROVIDER_SITE_OTHER): Payer: BLUE CROSS/BLUE SHIELD | Admitting: Obstetrics and Gynecology

## 2017-12-18 ENCOUNTER — Encounter: Payer: Self-pay | Admitting: Obstetrics and Gynecology

## 2017-12-18 VITALS — BP 98/62 | HR 63 | Wt 161.2 lb

## 2017-12-18 DIAGNOSIS — Z3482 Encounter for supervision of other normal pregnancy, second trimester: Secondary | ICD-10-CM

## 2017-12-18 DIAGNOSIS — Z3A21 21 weeks gestation of pregnancy: Secondary | ICD-10-CM | POA: Diagnosis not present

## 2017-12-18 DIAGNOSIS — Z363 Encounter for antenatal screening for malformations: Secondary | ICD-10-CM | POA: Diagnosis not present

## 2017-12-18 DIAGNOSIS — O359XX0 Maternal care for (suspected) fetal abnormality and damage, unspecified, not applicable or unspecified: Secondary | ICD-10-CM

## 2017-12-18 NOTE — Progress Notes (Signed)
ROB: Patient had anatomy scan today with extremity abnormality.  Landmarks and bone measurements not obtained or visualized.  [facility]  Patient initially unhappy about having to wait for her appointment and then unhappy regarding lack of information of the cause or the exact problem at ultrasound.  I discussed with her the lack of landmarks and the inability to do fetal extremity measurements.  Necessity for perinatal ultrasound discussed.  All questions answered to the best of my ability.

## 2017-12-29 ENCOUNTER — Telehealth: Payer: Self-pay | Admitting: Obstetrics and Gynecology

## 2017-12-29 ENCOUNTER — Telehealth: Payer: Self-pay

## 2017-12-29 NOTE — Telephone Encounter (Signed)
The Patient came into the office and stated that she needs to speak with a nurse or provider as soon as possible in regards to her being out of work for [employer] and also what should be done moving forward after her procedure will be done. No other information was disclosed, other than the patient wanting a call back as soon as possible. Please advise.

## 2017-12-29 NOTE — Telephone Encounter (Signed)
Attempted to contact pt- voicemail message left.

## 2017-12-30 ENCOUNTER — Telehealth: Payer: Self-pay

## 2017-12-30 NOTE — Telephone Encounter (Signed)
Spoke with pt- [facility] and told her baby will not survive. Pt has opted to go to [place] for termination. Pt need FMLA time off extended. She would like to be off until 01/23/18. Will fill out paperwork to reflect this date. Pt will be notified when complete.

## 2018-01-01 ENCOUNTER — Encounter: Payer: Self-pay | Admitting: Obstetrics and Gynecology

## 2018-01-07 ENCOUNTER — Telehealth: Payer: Self-pay

## 2018-01-07 NOTE — Telephone Encounter (Signed)
Spoke with Amanda Stuart from pts job regarding pts leave request. [facility] Per documentation from [facility] pt had not made a decision about proceeding with pregnancy. This Clinical research associatewriter spoke with pt and pt stated she has chosen to go to [place] for a "procedure" but she did not have a set date. She was asked to please let us know when that date is set for. FMLA papers were faxed 01/02/18 with 12/18/17 being the first day of leave per pt request. End date listed is 01/23/18 per pt. No restrictions were put on pt per Dr Logan BoresEvans last note. Also no restrictions placed by [facility] per their note. Advised Amanda Stuart to please reach out if any further assistance is needed.

## 2018-07-20 ENCOUNTER — Encounter (HOSPITAL_COMMUNITY): Payer: Self-pay
